# Patient Record
Sex: Male | Born: 1980 | Race: Black or African American | Hispanic: No | Marital: Single
Health system: Southern US, Community
[De-identification: ages and names within clinical notes are randomized; demographics above are authoritative.]

## PROBLEM LIST (undated history)

## (undated) DIAGNOSIS — F4310 Post-traumatic stress disorder, unspecified: Secondary | ICD-10-CM

---

## 2005-10-09 ENCOUNTER — Emergency Department (HOSPITAL_COMMUNITY): Admission: EM | Admit: 2005-10-09 | Discharge: 2005-10-10 | Payer: Self-pay | Admitting: Emergency Medicine

## 2020-11-05 ENCOUNTER — Ambulatory Visit (INDEPENDENT_AMBULATORY_CARE_PROVIDER_SITE_OTHER): Payer: No Typology Code available for payment source

## 2020-11-05 ENCOUNTER — Encounter (HOSPITAL_COMMUNITY): Payer: Self-pay | Admitting: Emergency Medicine

## 2020-11-05 ENCOUNTER — Other Ambulatory Visit: Payer: Self-pay

## 2020-11-05 ENCOUNTER — Ambulatory Visit (HOSPITAL_COMMUNITY)
Admission: EM | Admit: 2020-11-05 | Discharge: 2020-11-05 | Disposition: A | Discharge status: Home / Self Care | Payer: No Typology Code available for payment source

## 2020-11-05 DIAGNOSIS — S93492A Sprain of other ligament of left ankle, initial encounter: Secondary | ICD-10-CM

## 2020-11-05 HISTORY — DX: Post-traumatic stress disorder, unspecified: F43.10

## 2020-11-05 NOTE — ED Triage Notes (Signed)
Pt c/o rolling his left ankle almost two weeks ago.  Pt reports spider bite to right forearm about 5 days ago reports had drainage out of it and starting to look better.

## 2020-11-05 NOTE — ED Provider Notes (Signed)
[facility]    CSN: [id number] Arrival date & time: 11/05/20  0847      History   Chief Complaint Chief Complaint  Patient presents with   Ankle Pain   Insect Bite    HPI Jesus Green is a 40 y.o. male.   Patient reports rolling his left ankle after stepping down onto an uneven surface, states he fell and had acute onset of pain in left ankle.  States this occurred about a week and a half ago, had significant swelling in the left ankle that has since improved some, states he is able to bear weight but is very painful.  Patient states he has not been wrapping his ankle or icing the ankle, not currently taking any medication for it.  Patient states he felt it would get better on its own but feels that it should have been better by now and would like to have it evaluated.  The history is provided by the patient.  Ankle Pain Location:  Ankle Time since incident:  10 days Injury: yes   Mechanism of injury: fall   Fall:    Fall occurred:  Down stairs   Height of fall:  1 step onto uneven surface   Impact surface:  Hard floor   Point of impact:  Unable to specify   Entrapped after fall: no   Ankle location:  L ankle Pain details:    Quality:  Aching and sharp   Radiates to:  Does not radiate   Severity:  Moderate   Onset quality:  Sudden   Duration:  10 days   Timing:  Constant   Progression:  Partially resolved Chronicity:  New Dislocation: no   Foreign body present:  No foreign bodies Tetanus status:  Unknown Prior injury to area:  No Relieved by:  Rest Worsened by:  Bearing weight Ineffective treatments:  None tried Associated symptoms: decreased ROM, stiffness and swelling   Associated symptoms: no back pain and no muscle weakness   Risk factors: obesity   Risk factors: no concern for non-accidental trauma, no frequent fractures, no known bone disorder and no recent illness    Past Medical History:  Diagnosis Date   PTSD (post-traumatic stress  disorder)     There are no problems to display for this patient.   History reviewed. No pertinent surgical history.     Home Medications    Prior to Admission medications   Medication Sig Start Date End Date Taking? Authorizing Provider  gabapentin (NEURONTIN) 100 MG capsule Take 100 mg by mouth 3 (three) times daily.   Yes [provider]    Family History No family history on file.  Social History     Allergies   Patient has no known allergies.   Review of Systems Review of Systems  Musculoskeletal:  Positive for stiffness. Negative for back pain.  All other systems reviewed and are negative.   Physical Exam Triage Vital Signs ED Triage Vitals  Enc Vitals Group     BP 11/05/20 0935 (!) 141/87     Pulse Rate 11/05/20 0935 70     Resp 11/05/20 0935 17     Temp 11/05/20 0935 98.2 F (36.8 C)     Temp Source 11/05/20 0935 Oral     SpO2 11/05/20 0935 98 %     Weight --      Height --      Head Circumference --      Peak Flow --  Pain Score 11/05/20 0932 7     Pain Loc --      Pain Edu? --      Excl. in GC? --    No data found.  Updated Vital Signs BP (!) 141/87 (BP Location: Left Arm)   Pulse 70   Temp 98.2 F (36.8 C) (Oral)   Resp 17   SpO2 98%   Visual Acuity Right Eye Distance:   Left Eye Distance:   Bilateral Distance:    Right Eye Near:   Left Eye Near:    Bilateral Near:     Physical Exam Constitutional:      Appearance: Normal appearance.  Cardiovascular:     Rate and Rhythm: Normal rate and regular rhythm.     Heart sounds: Normal heart sounds.  Pulmonary:     Effort: Pulmonary effort is normal.     Breath sounds: Normal breath sounds.  Musculoskeletal:     Right ankle: Normal.     Right Achilles Tendon: Normal.     Left ankle: Swelling present. No deformity, ecchymosis or lacerations. Tenderness present over the lateral malleolus and ATF ligament. No medial malleolus, AITF ligament, CF ligament, posterior TF  ligament, base of 5th metatarsal or proximal fibula tenderness. Decreased range of motion. Anterior drawer test negative. Normal pulse.     Left Achilles Tendon: Normal.     Right foot: Normal.     Left foot: Normal range of motion. Tenderness present.  Neurological:     Mental Status: He is alert.     UC Treatments / Results  Labs (all labs ordered are listed, but only abnormal results are displayed) Labs Reviewed - No data to display  EKG   Radiology DG Ankle Complete Left  Result Date: 11/05/2020 CLINICAL DATA:  left ankle injury 2 weeks prior, soft tissue swelling and increasing pain EXAM: LEFT ANKLE COMPLETE - 3+ VIEW COMPARISON:  None. FINDINGS: Moderate lateral left ankle soft tissue swelling. No fracture. No subluxation. No suspicious focal osseous lesions. Small well corticated osseous fragments adjacent to the tip of the medial malleolus and along the dorsal talonavicular joint, favor remote injury, developmental ossicles or degenerative change. No radiopaque foreign bodies. IMPRESSION: Moderate lateral left ankle soft tissue swelling, with no fracture or subluxation. Small well corticated osseous fragments adjacent to the tip of the medial malleolus and along the dorsal talonavicular joint, favor remote injury, developmental ossicles or degenerative change. Electronically Signed   By: Delbert Phenix M.D.   On: 11/05/2020 10:34    Procedures Procedures (including critical care time)  Medications Ordered in UC Medications - No data to display  Initial Impression / Assessment and Plan / UC Course  I have reviewed the triage vital signs and the nursing notes.  Pertinent labs & imaging results that were available during my care of the patient were reviewed by me and considered in my medical decision making (see chart for details).     Ankle x-ray today demonstrates no acute fracture or subluxation.  There was some evidence of previous injury due to osseous fragments along the  medial malleolus, patient is not currently having any pain in this area.  Patient was placed in an ankle brace for stability and advised to avoid bearing weight as able.  Patient also advised that soft tissue sprain can sometimes take up to 6 weeks to completely heal and to be patient with this process.  Patient verbalized understanding of findings and agreed with plan going forward. Final Clinical Impressions(s) /  UC Diagnoses   Final diagnoses:  Sprain of anterior talofibular ligament of left ankle, initial encounter     Discharge Instructions      Your x-ray of your left ankle today did not reveal any acute fracture or abnormal alignment of any of the bones in your ankle or foot.You have been placed in an ankle brace which will provide you with some added stability.  Please avoid bearing weight on your left foot as much as possible to allow your ankle to heal completely.  Sprains can sometimes take as long as 6 weeks to completely resolve so please be patient with his process.  Please follow-up with your primary care provider if you do not have significant improvement in the next 2 weeks.     ED Prescriptions   None    PDMP not reviewed this encounter.   Theadora Rama Scales, PA-C 11/05/20 1106

## 2020-11-05 NOTE — Discharge Instructions (Addendum)
Your x-ray of your left ankle today did not reveal any acute fracture or abnormal alignment of any of the bones in your ankle or foot.You have been placed in an ankle brace which will provide you with some added stability.  Please avoid bearing weight on your left foot as much as possible to allow your ankle to heal completely.  Sprains can sometimes take as long as 6 weeks to completely resolve so please be patient with his process.  Please follow-up with your primary care provider if you do not have significant improvement in the next 2 weeks.

## 2020-12-21 ENCOUNTER — Encounter (HOSPITAL_COMMUNITY): Payer: Self-pay

## 2020-12-21 ENCOUNTER — Ambulatory Visit (HOSPITAL_COMMUNITY)
Admission: EM | Admit: 2020-12-21 | Discharge: 2020-12-21 | Disposition: A | Discharge status: Home / Self Care | Payer: No Typology Code available for payment source | Attending: Medical Oncology | Admitting: Medical Oncology

## 2020-12-21 ENCOUNTER — Ambulatory Visit (INDEPENDENT_AMBULATORY_CARE_PROVIDER_SITE_OTHER): Payer: No Typology Code available for payment source

## 2020-12-21 ENCOUNTER — Other Ambulatory Visit: Payer: Self-pay

## 2020-12-21 DIAGNOSIS — R509 Fever, unspecified: Secondary | ICD-10-CM

## 2020-12-21 DIAGNOSIS — R059 Cough, unspecified: Secondary | ICD-10-CM | POA: Diagnosis not present

## 2020-12-21 DIAGNOSIS — J020 Streptococcal pharyngitis: Secondary | ICD-10-CM

## 2020-12-21 DIAGNOSIS — R051 Acute cough: Secondary | ICD-10-CM

## 2020-12-21 LAB — POC INFLUENZA A AND B ANTIGEN (URGENT CARE ONLY)
INFLUENZA A ANTIGEN, POC: NEGATIVE
INFLUENZA B ANTIGEN, POC: NEGATIVE

## 2020-12-21 MED ORDER — AMOXICILLIN 500 MG PO CAPS: 500.0000 mg | ORAL_CAPSULE | Freq: Two times a day (BID) | ORAL | 0 refills | Status: AC

## 2020-12-21 MED ORDER — FLUTICASONE PROPIONATE 50 MCG/ACT NA SUSP: 2.0000 | Freq: Every day | NASAL | 0 refills | Status: AC

## 2020-12-21 MED ORDER — ACETAMINOPHEN 325 MG PO TABS
975.0000 mg | ORAL_TABLET | Freq: Once | ORAL | Status: AC
  Administered 2020-12-21: 975 mg via ORAL

## 2020-12-21 MED ORDER — ACETAMINOPHEN 325 MG PO TABS
ORAL_TABLET | ORAL | Status: AC
  Filled 2020-12-21: qty 1

## 2020-12-21 MED ORDER — ACETAMINOPHEN 325 MG PO TABS
ORAL_TABLET | ORAL | Status: AC
  Filled 2020-12-21: qty 2

## 2020-12-21 MED ORDER — BENZONATATE 100 MG PO CAPS: 100.0000 mg | ORAL_CAPSULE | Freq: Three times a day (TID) | ORAL | 0 refills | Status: AC

## 2020-12-21 MED ORDER — ACETAMINOPHEN 325 MG PO TABS
975.0000 mg | ORAL_TABLET | Freq: Once | ORAL | Status: DC
Start: 2020-12-21 — End: 2020-12-21

## 2020-12-21 NOTE — ED Provider Notes (Signed)
[facility]    CSN: [id number] Arrival date & time: 12/21/20  1636      History   Chief Complaint Chief Complaint  Patient presents with   URI   Generalized Body Aches   Headache    HPI Jesus Green is a 40 y.o. male.   HPI  Sore Throat: Patient reports that he has had a sore throat, headache, body aches and fever for the past 1/2 to 2 weeks. He does have a mild cough and mild nasal discharge but his worst symptom is a sore throat and fever.  No known sick contacts.  He has tried over-the-counter cold and flu medication without much relief.  He denies any productive cough, chest pain, neck stiffness.   Past Medical History:  Diagnosis Date   PTSD (post-traumatic stress disorder)     There are no problems to display for this patient.   History reviewed. No pertinent surgical history.   Home Medications    Prior to Admission medications   Medication Sig Start Date End Date Taking? Authorizing Provider  amoxicillin (AMOXIL) 500 MG capsule Take 1 capsule (500 mg total) by mouth 2 (two) times daily for 10 days. 12/21/20 12/31/20 Yes Lyle Niblett M, PA-C  benzonatate (TESSALON) 100 MG capsule Take 1 capsule (100 mg total) by mouth every 8 (eight) hours. 12/21/20  Yes Nana Hoselton M, PA-C  fluticasone [facility]) 50 MCG/ACT nasal spray Place 2 sprays into both nostrils daily. 12/21/20  Yes Charday Capetillo, Maralyn Sago M, PA-C  gabapentin (NEURONTIN) 100 MG capsule Take 100 mg by mouth 3 (three) times daily.    [provider]    Family History Family History  Family history unknown: Yes    Social History     Allergies   Patient has no known allergies.   Review of Systems Review of Systems  As stated above in HPI Physical Exam Triage Vital Signs ED Triage Vitals  Enc Vitals Group     BP 12/21/20 1754 (!) 153/114     Pulse Rate 12/21/20 1754 (!) 110     Resp 12/21/20 1754 18     Temp 12/21/20 1754 (!) 103 F (39.4 C)     Temp Source  12/21/20 1754 Oral     SpO2 12/21/20 1754 99 %     Weight --      Height --      Head Circumference --      Peak Flow --      Pain Score 12/21/20 1753 6     Pain Loc --      Pain Edu? --      Excl. in GC? --    No data found.  Updated Vital Signs BP (!) 153/114 (BP Location: Right Arm)   Pulse (!) 110   Temp (!) 103 F (39.4 C) (Oral)   Resp 18   SpO2 99%   Physical Exam Vitals and nursing note reviewed.  Constitutional:      General: He is not in acute distress.    Appearance: He is well-developed. He is ill-appearing and diaphoretic. He is not toxic-appearing.  HENT:     Head: Normocephalic and atraumatic.     Mouth/Throat:     Mouth: Mucous membranes are moist.     Pharynx: Oropharynx is clear. Posterior oropharyngeal erythema present.     Tonsils: Tonsillar exudate present. 3+ on the right. 3+ on the left.  Eyes:     Extraocular Movements: Extraocular movements intact.  Pupils: Pupils are equal, round, and reactive to light.  Cardiovascular:     Rate and Rhythm: Regular rhythm. Tachycardia present.     Heart sounds: Normal heart sounds.  Pulmonary:     Effort: Pulmonary effort is normal.     Breath sounds: Normal breath sounds.  Abdominal:     Palpations: Abdomen is soft.  Musculoskeletal:     Cervical back: Normal range of motion and neck supple.  Lymphadenopathy:     Cervical: Cervical adenopathy present.  Skin:    General: Skin is warm.  Neurological:     Mental Status: He is alert and oriented to person, place, and time.     UC Treatments / Results  Labs (all labs ordered are listed, but only abnormal results are displayed) Labs Reviewed  POC INFLUENZA A AND B ANTIGEN (URGENT CARE ONLY)    EKG   Radiology DG Chest 2 View  Result Date: 12/21/2020 CLINICAL DATA:  Fever and chills.  Cough.  Symptoms for 2 weeks. EXAM: CHEST - 2 VIEW COMPARISON:  None. FINDINGS: Upper normal heart size.The cardiomediastinal contours are normal. Mild bronchial  thickening. Pulmonary vasculature is normal. No consolidation, pleural effusion, or pneumothorax. No acute osseous abnormalities are seen. IMPRESSION: Mild bronchial thickening. No pneumonia. Electronically Signed   By: Narda Rutherford M.D.   On: 12/21/2020 18:48    Procedures Procedures (including critical care time)  Medications Ordered in UC Medications  acetaminophen (TYLENOL) tablet 975 mg (has no administration in time range)  acetaminophen (TYLENOL) tablet 975 mg (975 mg Oral Given 12/21/20 1813)    Initial Impression / Assessment and Plan / UC Course  I have reviewed the triage vital signs and the nursing notes.  Pertinent labs & imaging results that were available during my care of the patient were reviewed by me and considered in my medical decision making (see chart for details).     New.  Treating for strep pharyngitis as he meets criteria.  Treating with Amoxil.  We discussed how to use this medication along with strep precautions and advisement.  In addition we discussed his cough and I am going to check a chest x-ray to ensure there is not any concurrent pneumonia given his severity of symptoms and cough.  Sending in [place] and Flonase to help with the symptoms as well.  Rest and hydration along with fever monitoring encouraged.  Follow-up as needed. Final Clinical Impressions(s) / UC Diagnoses   Final diagnoses:  Acute cough  Streptococcal sore throat  Fever, unspecified   Discharge Instructions   None    ED Prescriptions     Medication Sig Dispense Auth. Provider   benzonatate (TESSALON) 100 MG capsule Take 1 capsule (100 mg total) by mouth every 8 (eight) hours. 21 capsule Kaitlen Redford M, [place]   amoxicillin (AMOXIL) 500 MG capsule Take 1 capsule (500 mg total) by mouth 2 (two) times daily for 10 days. 20 capsule Tandy Lewin M, PA-C   fluticasone [facility]) 50 MCG/ACT nasal spray Place 2 sprays into both nostrils daily. 16 mL Rushie Chestnut, [place]       PDMP not reviewed this encounter.   Rushie Chestnut, [place] 12/21/20 1856

## 2020-12-21 NOTE — ED Triage Notes (Signed)
Pt presents with cough, chills, generalized body aches, headache, and fatigue X 2 weeks.

## 2020-12-26 ENCOUNTER — Ambulatory Visit: Payer: Self-pay | Admitting: Nurse Practitioner

## 2022-10-12 IMAGING — DX DG CHEST 2V
2 series · 2 of 2 positions shown · non-contrast
Comparison: None.

CLINICAL DATA: Fever and chills.  Cough.  Symptoms for 2 weeks.

EXAM:
CHEST - 2 VIEW

[chest pa]
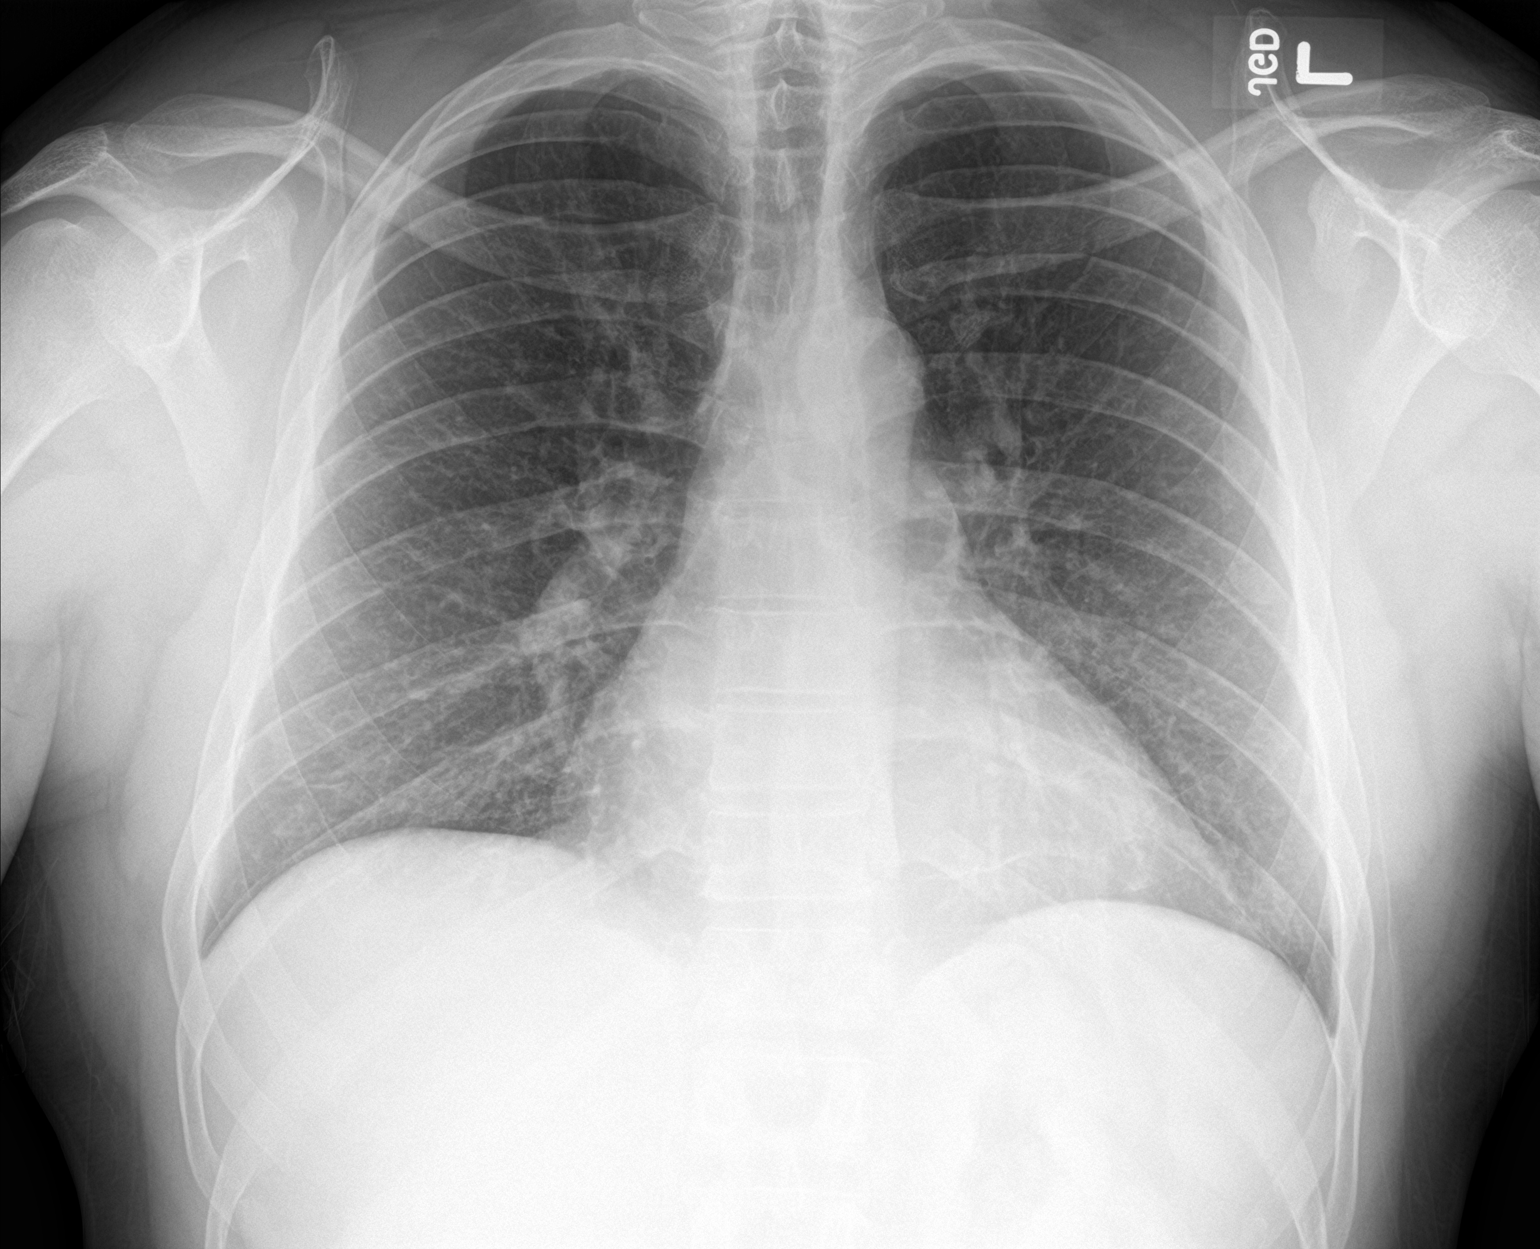

[chest lat]
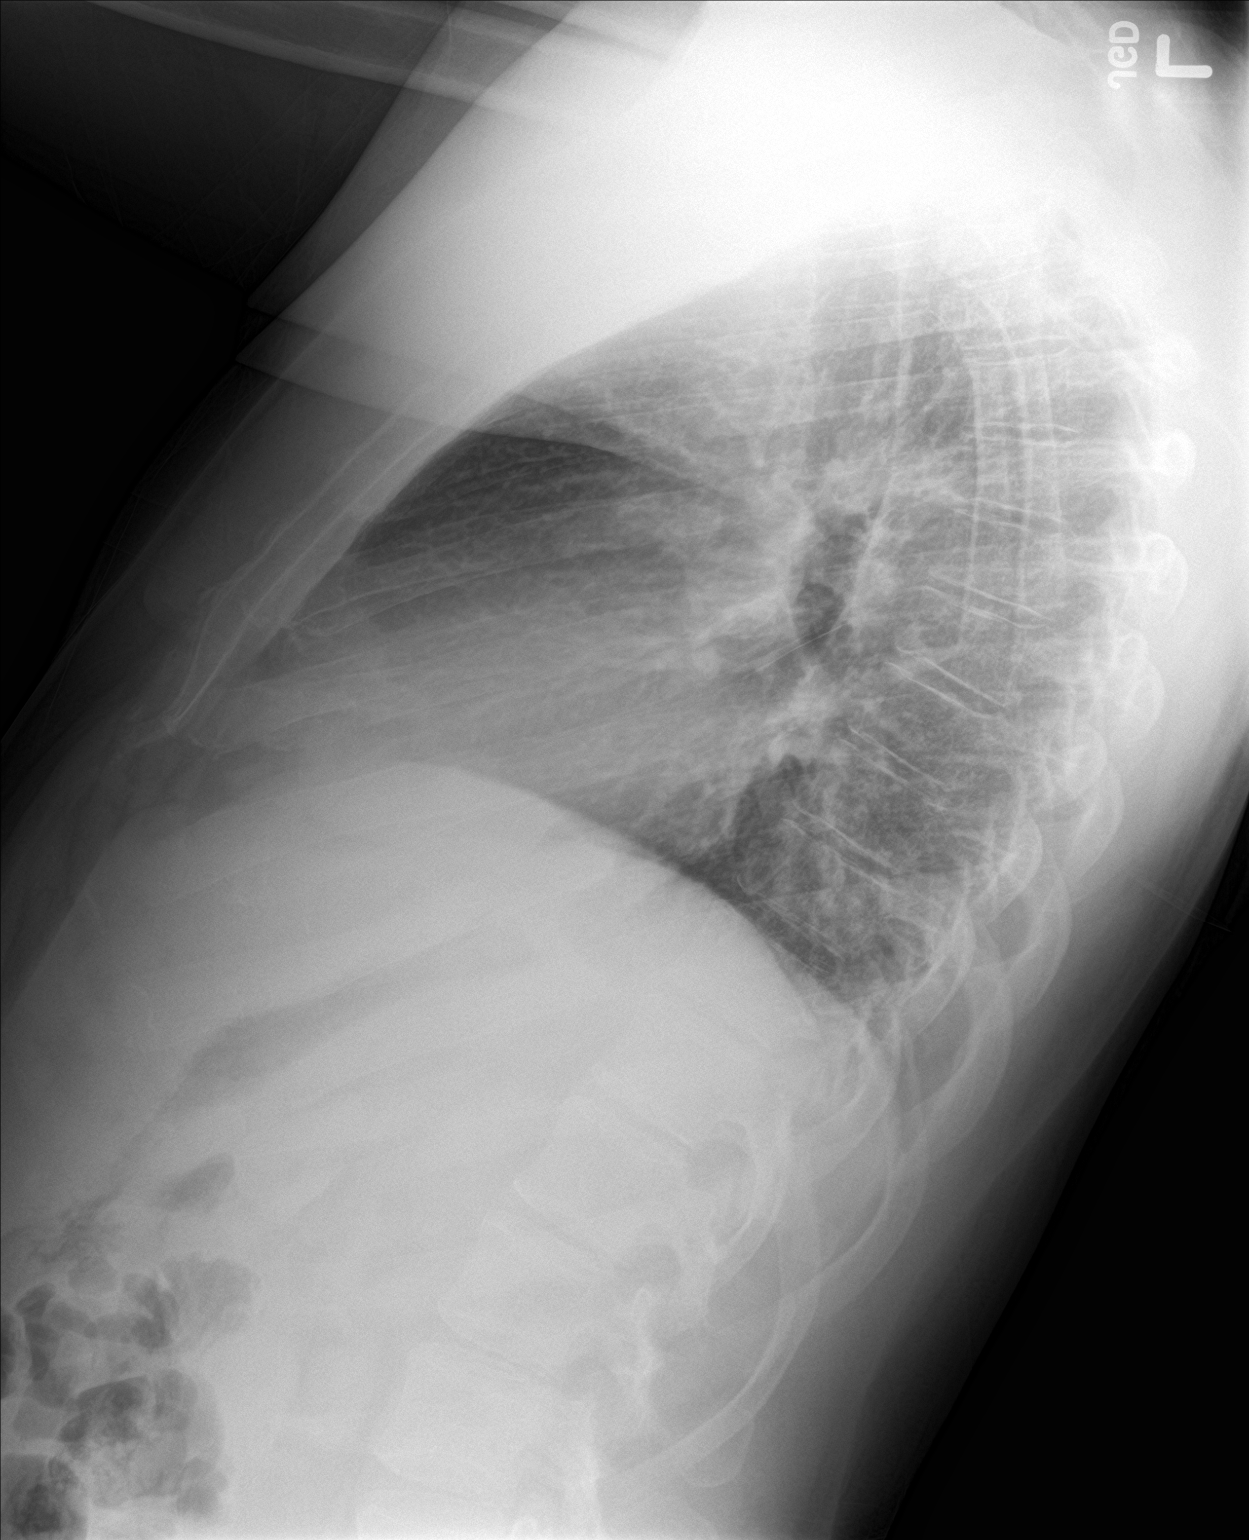

[2 of 2 positions shown; findings below may reference images not displayed]

FINDINGS: Upper normal heart size.The cardiomediastinal contours are normal.
Mild bronchial thickening. Pulmonary vasculature is normal. No
consolidation, pleural effusion, or pneumothorax. No acute osseous
abnormalities are seen.
IMPRESSION: Mild bronchial thickening. No pneumonia.

## 2023-09-24 DEATH — deceased
